# Patient Record
Sex: Female | Born: 2000 | Race: White | Hispanic: Yes | Marital: Single | Smoking: Never smoker
Health system: Southern US, Community
[De-identification: ages and names within clinical notes are randomized; demographics above are authoritative.]

---
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
## 2018-01-27 IMAGING — DX DG HIP (WITH OR WITHOUT PELVIS) 2-3V*R*
3 series · 3 of 3 positions shown · non-contrast
Comparison: None.

CLINICAL DATA: Low back pain for 7 years, right hip pain new,
sudden onset. No injury.

EXAM:
DG HIP (WITH OR WITHOUT PELVIS) 2-3V RIGHT

[pelvis ap]
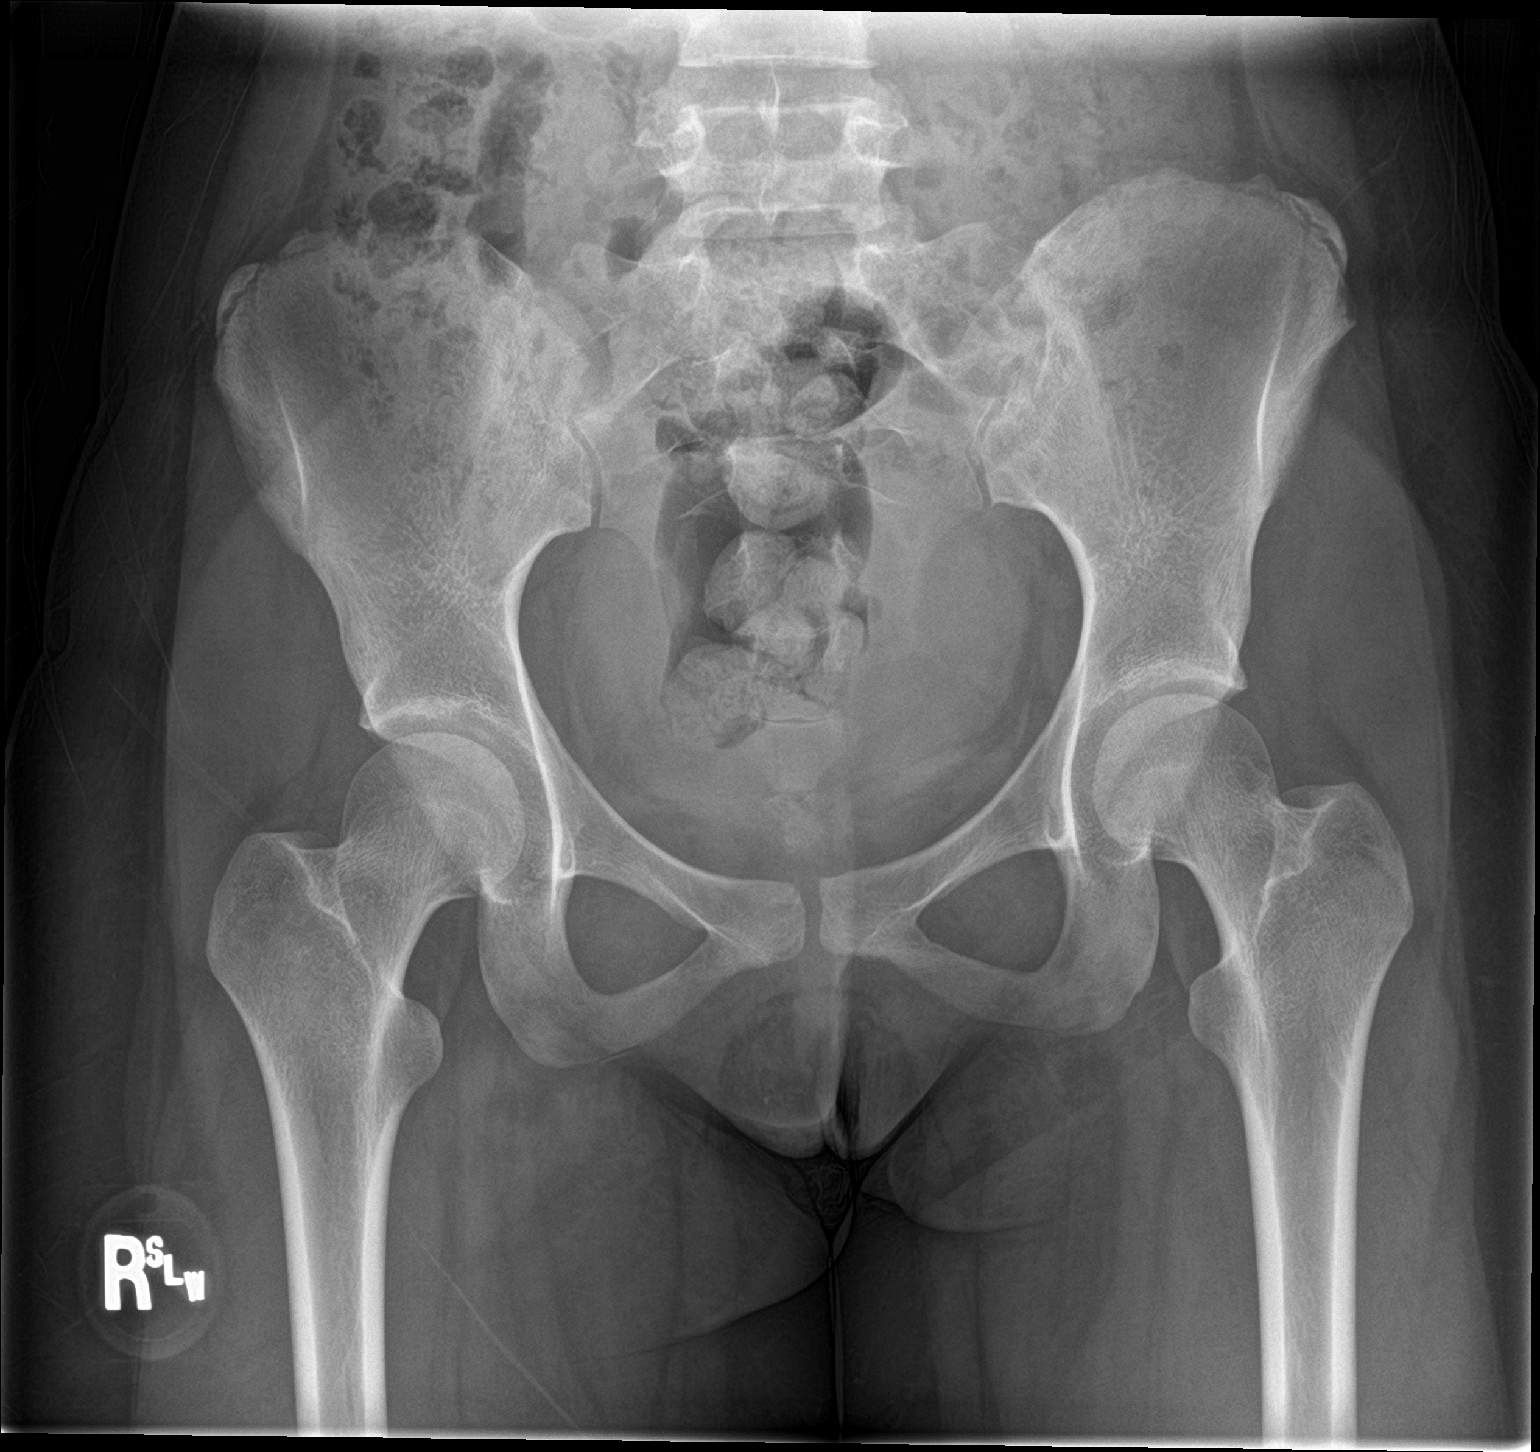

[hip ap]
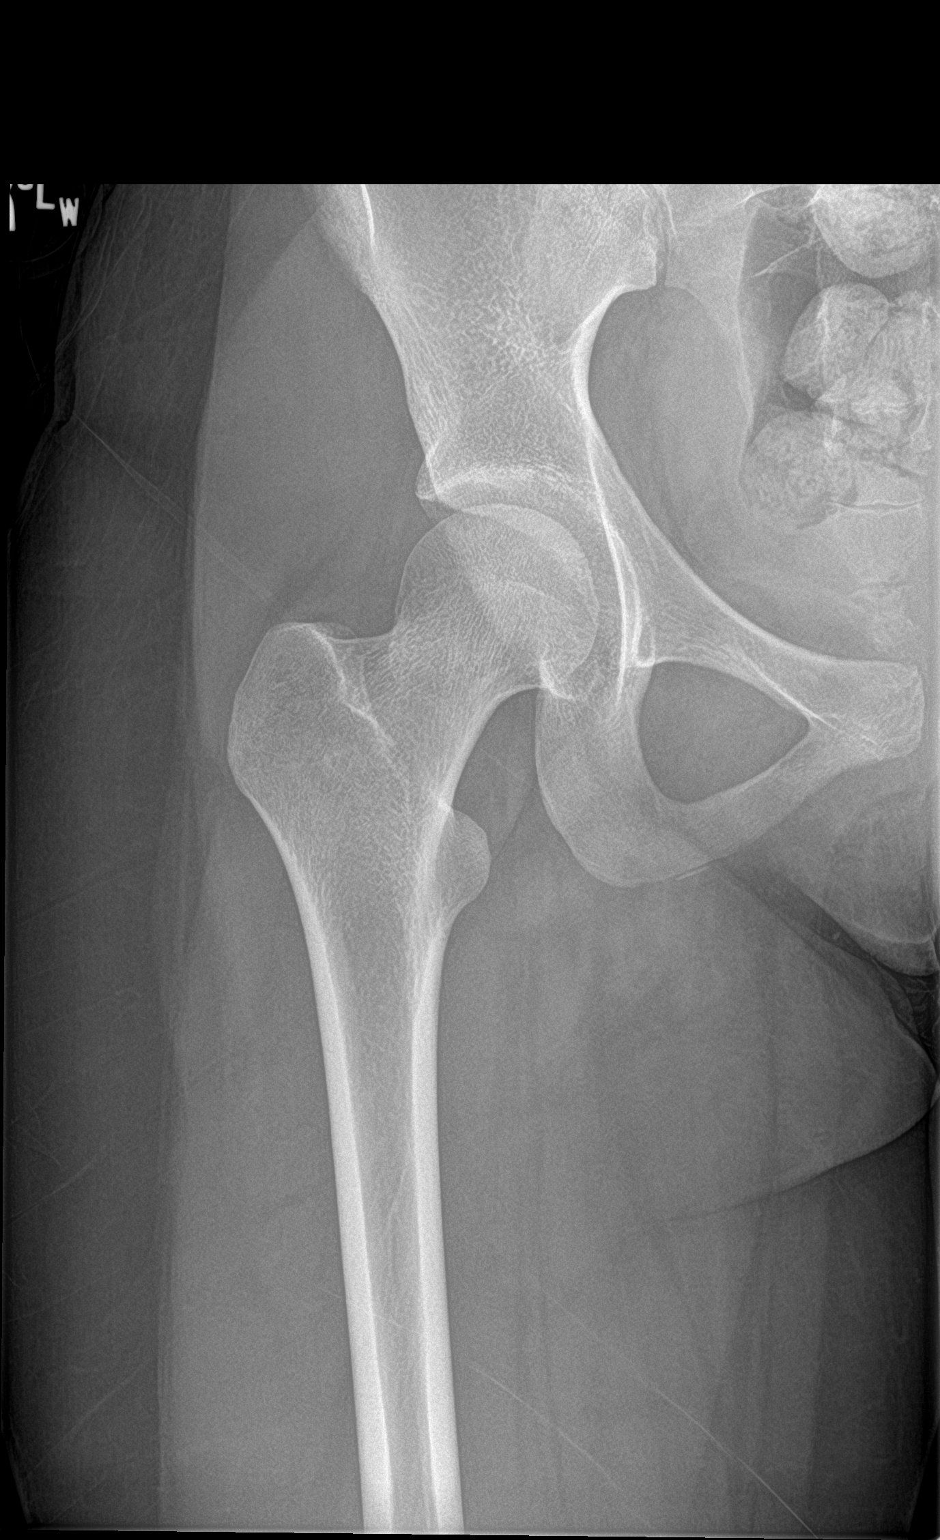

[hip lat]
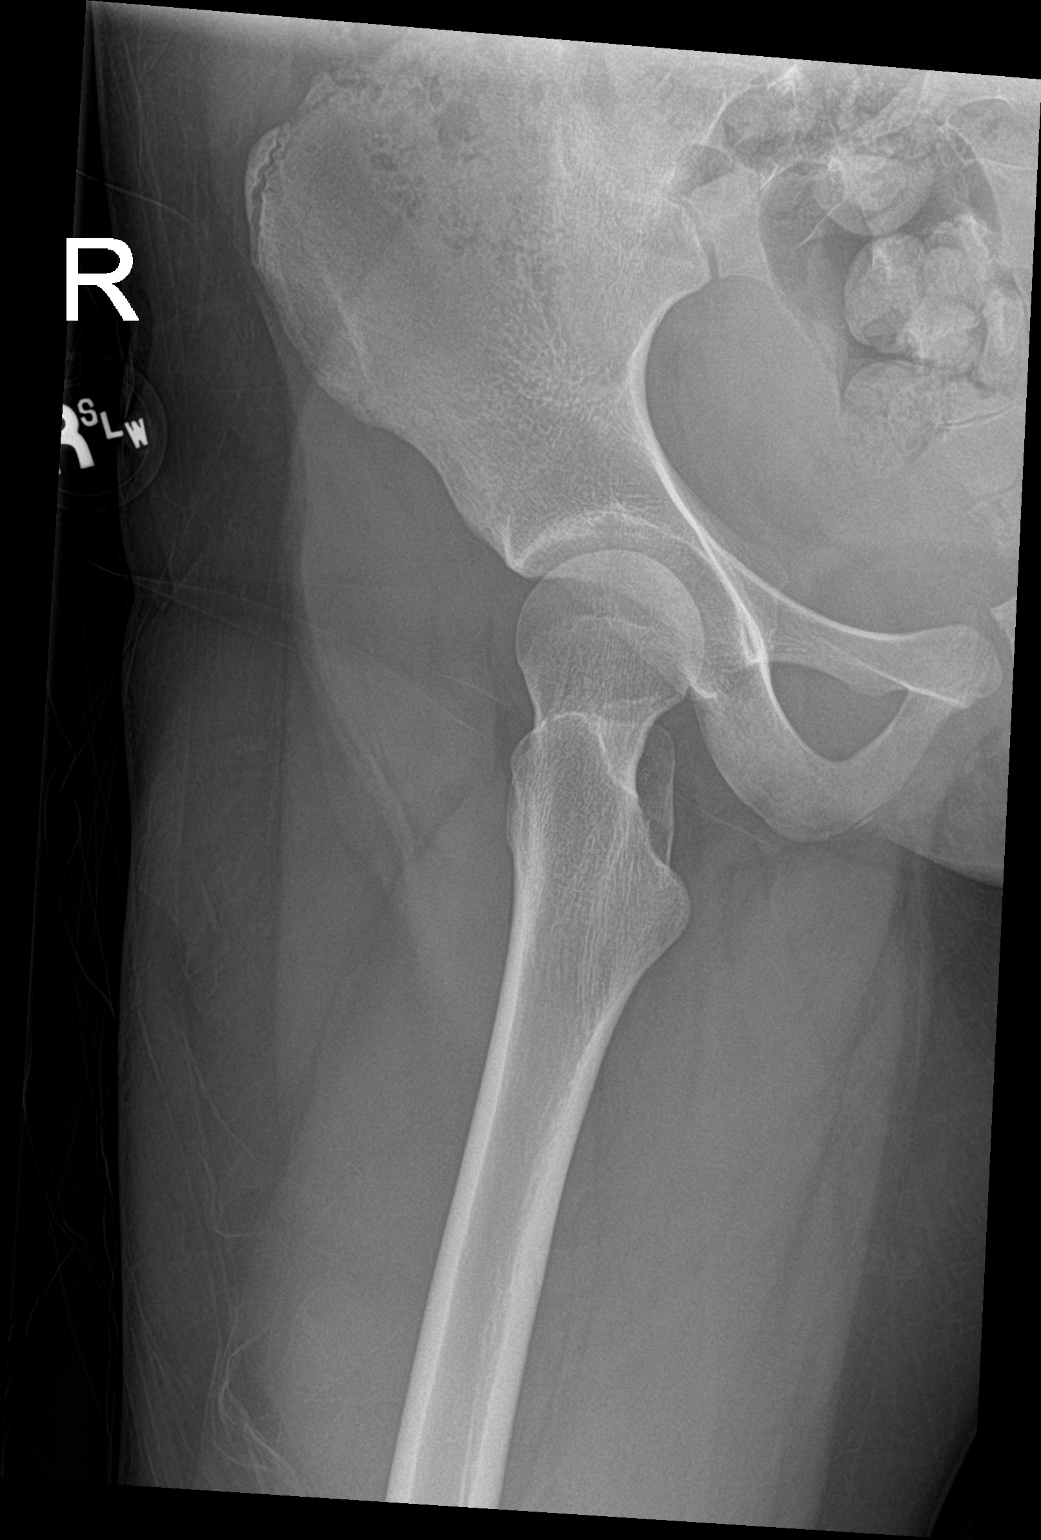

[3 of 3 positions shown; findings below may reference images not displayed]

FINDINGS: Hip joints and SI joints are symmetric and unremarkable. No acute
bony abnormality. Specifically, no fracture, subluxation, or
dislocation. Soft tissues are intact.
IMPRESSION: Negative.
